# Patient Record
Sex: Female | Born: 1955
Health system: Southern US, Community
[De-identification: ages and names within clinical notes are randomized; demographics above are authoritative.]

## PROBLEM LIST (undated history)

## (undated) DIAGNOSIS — M1990 Unspecified osteoarthritis, unspecified site: Secondary | ICD-10-CM

## (undated) DIAGNOSIS — F419 Anxiety disorder, unspecified: Secondary | ICD-10-CM

## (undated) HISTORY — DX: Unspecified osteoarthritis, unspecified site: M19.90

## (undated) HISTORY — PX: FOOT SURGERY: SHX648

## (undated) HISTORY — DX: Anxiety disorder, unspecified: F41.9

---

## 1999-11-10 ENCOUNTER — Encounter: Payer: Self-pay | Admitting: Orthopedic Surgery

## 1999-11-14 ENCOUNTER — Encounter (INDEPENDENT_AMBULATORY_CARE_PROVIDER_SITE_OTHER): Payer: Self-pay

## 1999-11-14 ENCOUNTER — Ambulatory Visit (HOSPITAL_COMMUNITY): Admission: RE | Admit: 1999-11-14 | Discharge: 1999-11-14 | Payer: Self-pay | Admitting: Orthopedic Surgery

## 1999-12-15 ENCOUNTER — Encounter: Admission: RE | Admit: 1999-12-15 | Discharge: 1999-12-15 | Payer: Self-pay | Admitting: Family Medicine

## 1999-12-15 ENCOUNTER — Encounter: Payer: Self-pay | Admitting: Family Medicine

## 2000-12-17 ENCOUNTER — Encounter: Admission: RE | Admit: 2000-12-17 | Discharge: 2000-12-17 | Payer: Self-pay | Admitting: Family Medicine

## 2000-12-17 ENCOUNTER — Encounter: Payer: Self-pay | Admitting: Family Medicine

## 2002-01-01 ENCOUNTER — Encounter: Payer: Self-pay | Admitting: Family Medicine

## 2002-01-01 ENCOUNTER — Encounter: Admission: RE | Admit: 2002-01-01 | Discharge: 2002-01-01 | Payer: Self-pay | Admitting: Family Medicine

## 2002-12-31 ENCOUNTER — Encounter: Payer: Self-pay | Admitting: Family Medicine

## 2002-12-31 ENCOUNTER — Encounter: Admission: RE | Admit: 2002-12-31 | Discharge: 2002-12-31 | Payer: Self-pay | Admitting: Family Medicine

## 2004-01-13 ENCOUNTER — Encounter: Admission: RE | Admit: 2004-01-13 | Discharge: 2004-01-13 | Payer: Self-pay | Admitting: Family Medicine

## 2004-07-04 ENCOUNTER — Encounter: Admission: RE | Admit: 2004-07-04 | Discharge: 2004-07-04 | Payer: Self-pay | Admitting: Family Medicine

## 2005-02-01 ENCOUNTER — Encounter: Admission: RE | Admit: 2005-02-01 | Discharge: 2005-02-01 | Payer: Self-pay | Admitting: Family Medicine

## 2005-02-07 ENCOUNTER — Encounter: Admission: RE | Admit: 2005-02-07 | Discharge: 2005-02-07 | Payer: Self-pay | Admitting: Family Medicine

## 2005-03-01 ENCOUNTER — Other Ambulatory Visit: Admission: RE | Admit: 2005-03-01 | Discharge: 2005-03-01 | Payer: Self-pay | Admitting: Family Medicine

## 2006-02-11 ENCOUNTER — Encounter: Admission: RE | Admit: 2006-02-11 | Discharge: 2006-02-11 | Payer: Self-pay | Admitting: Family Medicine

## 2006-05-22 ENCOUNTER — Other Ambulatory Visit: Admission: RE | Admit: 2006-05-22 | Discharge: 2006-05-22 | Payer: Self-pay | Admitting: Family Medicine

## 2007-03-26 ENCOUNTER — Encounter: Admission: RE | Admit: 2007-03-26 | Discharge: 2007-03-26 | Payer: Self-pay | Admitting: Family Medicine

## 2007-09-29 ENCOUNTER — Other Ambulatory Visit: Admission: RE | Admit: 2007-09-29 | Discharge: 2007-09-29 | Payer: Self-pay | Admitting: Family Medicine

## 2008-05-04 ENCOUNTER — Encounter: Admission: RE | Admit: 2008-05-04 | Discharge: 2008-05-04 | Payer: Self-pay | Admitting: Family Medicine

## 2008-10-06 ENCOUNTER — Other Ambulatory Visit: Admission: RE | Admit: 2008-10-06 | Discharge: 2008-10-06 | Payer: Self-pay | Admitting: Family Medicine

## 2009-05-06 ENCOUNTER — Encounter: Admission: RE | Admit: 2009-05-06 | Discharge: 2009-05-06 | Payer: Self-pay | Admitting: Family Medicine

## 2010-01-04 ENCOUNTER — Other Ambulatory Visit: Admission: RE | Admit: 2010-01-04 | Discharge: 2010-01-04 | Payer: Self-pay | Admitting: Family Medicine

## 2010-05-08 ENCOUNTER — Encounter: Admission: RE | Admit: 2010-05-08 | Discharge: 2010-05-08 | Payer: Self-pay | Admitting: Family Medicine

## 2010-11-12 ENCOUNTER — Encounter: Payer: Self-pay | Admitting: Family Medicine

## 2011-04-09 ENCOUNTER — Other Ambulatory Visit: Payer: Self-pay | Admitting: Family Medicine

## 2011-04-09 DIAGNOSIS — Z1231 Encounter for screening mammogram for malignant neoplasm of breast: Secondary | ICD-10-CM

## 2011-05-10 ENCOUNTER — Ambulatory Visit
Admission: RE | Admit: 2011-05-10 | Discharge: 2011-05-10 | Disposition: A | Discharge status: Home / Self Care | Payer: 59 | Source: Ambulatory Visit | Attending: Family Medicine | Admitting: Family Medicine

## 2011-05-10 DIAGNOSIS — Z1231 Encounter for screening mammogram for malignant neoplasm of breast: Secondary | ICD-10-CM

## 2012-06-02 ENCOUNTER — Other Ambulatory Visit: Payer: Self-pay | Admitting: Family Medicine

## 2012-06-02 DIAGNOSIS — Z1231 Encounter for screening mammogram for malignant neoplasm of breast: Secondary | ICD-10-CM

## 2012-06-20 ENCOUNTER — Ambulatory Visit
Admission: RE | Admit: 2012-06-20 | Discharge: 2012-06-20 | Disposition: A | Discharge status: Home / Self Care | Payer: 59 | Source: Ambulatory Visit | Attending: Family Medicine | Admitting: Family Medicine

## 2012-06-20 DIAGNOSIS — Z1231 Encounter for screening mammogram for malignant neoplasm of breast: Secondary | ICD-10-CM

## 2013-09-11 ENCOUNTER — Other Ambulatory Visit: Payer: Self-pay

## 2013-09-11 DIAGNOSIS — Z1231 Encounter for screening mammogram for malignant neoplasm of breast: Secondary | ICD-10-CM

## 2013-10-08 ENCOUNTER — Ambulatory Visit: Payer: 59

## 2013-10-09 ENCOUNTER — Ambulatory Visit: Payer: 59

## 2013-10-12 ENCOUNTER — Ambulatory Visit
Admission: RE | Admit: 2013-10-12 | Discharge: 2013-10-12 | Disposition: A | Discharge status: Home / Self Care | Payer: 59 | Source: Ambulatory Visit

## 2013-10-12 DIAGNOSIS — Z1231 Encounter for screening mammogram for malignant neoplasm of breast: Secondary | ICD-10-CM

## 2013-12-09 ENCOUNTER — Other Ambulatory Visit: Payer: Self-pay | Admitting: Family Medicine

## 2013-12-09 ENCOUNTER — Other Ambulatory Visit (HOSPITAL_COMMUNITY)
Admission: RE | Admit: 2013-12-09 | Discharge: 2013-12-09 | Disposition: A | Discharge status: Home / Self Care | Payer: 59 | Source: Ambulatory Visit | Attending: Family Medicine | Admitting: Family Medicine

## 2013-12-09 DIAGNOSIS — Z124 Encounter for screening for malignant neoplasm of cervix: Secondary | ICD-10-CM | POA: Insufficient documentation

## 2014-06-03 ENCOUNTER — Ambulatory Visit (INDEPENDENT_AMBULATORY_CARE_PROVIDER_SITE_OTHER): Payer: BC Managed Care – PPO | Admitting: Physician Assistant

## 2014-06-03 VITALS — BP 110/70 | HR 62 | Temp 98.3°F | Resp 16 | Ht 66.0 in | Wt 147.0 lb

## 2014-06-03 DIAGNOSIS — J029 Acute pharyngitis, unspecified: Secondary | ICD-10-CM

## 2014-06-03 LAB — POCT RAPID STREP A (OFFICE): Rapid Strep A Screen: NEGATIVE

## 2014-06-03 MED ORDER — FIRST-DUKES MOUTHWASH MT SUSP: 5.0000 mL | OROMUCOSAL | Status: DC | PRN

## 2014-06-03 NOTE — Progress Notes (Signed)
   Subjective:    Patient ID: Brianna Olson, female    DOB: 1956/09/07, 58 y.o.   MRN: [id number]  HPI 58 year old female presents for evaluation of acute onset of sore throat.  States symptoms started this morning when she woke up.  She has had chills and fatigue but no documented fever.  Admits she is worried because her husband just had bilateral knee replacement and is still in the hospital - she does not want to get him sick.   Denies nausea, vomiting, congestion, cough, dizziness, ear pain, or sinus congestion.   No known strep contacts or hx of frequent strep infections.                                                                                                                      Review of Systems  Constitutional: Positive for chills and fatigue. Negative for fever.  HENT: Positive for sore throat. Negative for congestion, ear pain and sinus pressure.   Respiratory: Negative for cough and shortness of breath.   Gastrointestinal: Negative for nausea and vomiting.  Neurological: Negative for dizziness and headaches.       Objective:   Physical Exam  Constitutional: She is oriented to person, place, and time. She appears well-developed and well-nourished.  HENT:  Head: Normocephalic and atraumatic.  Right Ear: Hearing, tympanic membrane, external ear and ear canal normal.  Left Ear: Hearing, tympanic membrane, external ear and ear canal normal.  Mouth/Throat: Uvula is midline and mucous membranes are normal. Posterior oropharyngeal erythema present. No oropharyngeal exudate, posterior oropharyngeal edema or tonsillar abscesses.  Eyes: Conjunctivae are normal.  Neck: Normal range of motion. Neck supple.  Cardiovascular: Normal rate, regular rhythm and normal heart sounds.   Pulmonary/Chest: Effort normal and breath sounds normal.  Lymphadenopathy:    She has no cervical adenopathy.  Neurological: She is alert and oriented to person, place, and time.  Psychiatric: She has a  normal mood and affect. Her behavior is normal. Judgment and thought content normal.     Results for orders placed in visit on 06/03/14  POCT RAPID STREP A (OFFICE)      Result Value Ref Range   Rapid Strep A Screen Negative  Negative          Assessment & Plan:  Acute pharyngitis, unspecified pharyngitis type - Plan: POCT rapid strep A, Throat culture Loney Loh([facility]), Diphenhyd-Hydrocort-Nystatin (FIRST-DUKES MOUTHWASH) SUSP  Likely viral pharyngitis - will treat conservatively  May use duke's mouthwash q2-3hours prn pain. Recommend ibuprofen or tylenol  Good handwashing. RTC precautions discussed.  Follow up if symptoms worsening or fail to improve.

## 2014-06-05 LAB — CULTURE, GROUP A STREP: Organism ID, Bacteria: NORMAL

## 2014-11-22 ENCOUNTER — Other Ambulatory Visit: Payer: Self-pay

## 2014-11-22 DIAGNOSIS — Z1231 Encounter for screening mammogram for malignant neoplasm of breast: Secondary | ICD-10-CM

## 2014-11-30 ENCOUNTER — Ambulatory Visit: Payer: Self-pay

## 2014-12-07 ENCOUNTER — Ambulatory Visit: Payer: Self-pay

## 2014-12-17 ENCOUNTER — Ambulatory Visit
Admission: RE | Admit: 2014-12-17 | Discharge: 2014-12-17 | Disposition: A | Discharge status: Home / Self Care | Payer: BLUE CROSS/BLUE SHIELD | Source: Ambulatory Visit

## 2014-12-17 DIAGNOSIS — Z1231 Encounter for screening mammogram for malignant neoplasm of breast: Secondary | ICD-10-CM

## 2015-05-24 ENCOUNTER — Other Ambulatory Visit (HOSPITAL_COMMUNITY): Payer: Self-pay | Admitting: Otolaryngology

## 2015-05-24 DIAGNOSIS — K118 Other diseases of salivary glands: Secondary | ICD-10-CM

## 2015-05-26 ENCOUNTER — Other Ambulatory Visit (HOSPITAL_COMMUNITY): Payer: Self-pay | Admitting: Otolaryngology

## 2015-05-26 ENCOUNTER — Ambulatory Visit (HOSPITAL_COMMUNITY)
Admission: RE | Admit: 2015-05-26 | Discharge: 2015-05-26 | Disposition: A | Discharge status: Home / Self Care | Payer: BLUE CROSS/BLUE SHIELD | Source: Ambulatory Visit | Attending: Otolaryngology | Admitting: Otolaryngology

## 2015-05-26 DIAGNOSIS — R220 Localized swelling, mass and lump, head: Secondary | ICD-10-CM | POA: Diagnosis not present

## 2015-05-26 DIAGNOSIS — K118 Other diseases of salivary glands: Secondary | ICD-10-CM

## 2015-05-31 ENCOUNTER — Other Ambulatory Visit: Payer: Self-pay | Admitting: Radiology

## 2015-06-01 ENCOUNTER — Ambulatory Visit (HOSPITAL_COMMUNITY)
Admission: RE | Admit: 2015-06-01 | Discharge: 2015-06-01 | Disposition: A | Discharge status: Home / Self Care | Payer: BLUE CROSS/BLUE SHIELD | Source: Ambulatory Visit | Attending: Otolaryngology | Admitting: Otolaryngology

## 2015-06-01 ENCOUNTER — Encounter (HOSPITAL_COMMUNITY): Payer: Self-pay

## 2015-06-01 DIAGNOSIS — K119 Disease of salivary gland, unspecified: Secondary | ICD-10-CM | POA: Diagnosis not present

## 2015-06-01 DIAGNOSIS — F17200 Nicotine dependence, unspecified, uncomplicated: Secondary | ICD-10-CM | POA: Diagnosis not present

## 2015-06-01 DIAGNOSIS — R221 Localized swelling, mass and lump, neck: Secondary | ICD-10-CM | POA: Diagnosis present

## 2015-06-01 DIAGNOSIS — K118 Other diseases of salivary glands: Secondary | ICD-10-CM

## 2015-06-01 LAB — CBC WITH DIFFERENTIAL/PLATELET
Basophils Absolute: 0 10*3/uL (ref 0.0–0.1)
Basophils Relative: 1 % (ref 0–1)
EOS PCT: 4 % (ref 0–5)
Eosinophils Absolute: 0.3 10*3/uL (ref 0.0–0.7)
HEMATOCRIT: 40.2 % (ref 36.0–46.0)
Hemoglobin: 13.4 g/dL (ref 12.0–15.0)
LYMPHS ABS: 1.9 10*3/uL (ref 0.7–4.0)
LYMPHS PCT: 29 % (ref 12–46)
MCH: 30.8 pg (ref 26.0–34.0)
MCHC: 33.3 g/dL (ref 30.0–36.0)
MCV: 92.4 fL (ref 78.0–100.0)
MONO ABS: 0.5 10*3/uL (ref 0.1–1.0)
Monocytes Relative: 8 % (ref 3–12)
Neutro Abs: 3.8 10*3/uL (ref 1.7–7.7)
Neutrophils Relative %: 58 % (ref 43–77)
Platelets: 287 10*3/uL (ref 150–400)
RBC: 4.35 MIL/uL (ref 3.87–5.11)
RDW: 13.4 % (ref 11.5–15.5)
WBC: 6.5 10*3/uL (ref 4.0–10.5)

## 2015-06-01 LAB — PROTIME-INR
INR: 1.09 (ref 0.00–1.49)
Prothrombin Time: 14.3 seconds (ref 11.6–15.2)

## 2015-06-01 MED ORDER — MIDAZOLAM HCL 2 MG/2ML IJ SOLN
INTRAMUSCULAR | Status: AC
  Filled 2015-06-01: qty 2

## 2015-06-01 MED ORDER — FENTANYL CITRATE (PF) 100 MCG/2ML IJ SOLN
INTRAMUSCULAR | Status: AC
  Filled 2015-06-01: qty 2

## 2015-06-01 MED ORDER — HYDROCODONE-ACETAMINOPHEN 5-325 MG PO TABS: 1.0000 | ORAL_TABLET | ORAL | Status: DC | PRN

## 2015-06-01 MED ORDER — MIDAZOLAM HCL 2 MG/2ML IJ SOLN
INTRAMUSCULAR | Status: AC | PRN
  Administered 2015-06-01 (×4): 1 mg via INTRAVENOUS

## 2015-06-01 MED ORDER — SODIUM CHLORIDE 0.9 % IV SOLN
INTRAVENOUS | Status: DC
  Administered 2015-06-01: 14:00:00 via INTRAVENOUS

## 2015-06-01 MED ORDER — FENTANYL CITRATE (PF) 100 MCG/2ML IJ SOLN
INTRAMUSCULAR | Status: AC | PRN
  Administered 2015-06-01 (×3): 50 ug via INTRAVENOUS

## 2015-06-01 MED ORDER — LIDOCAINE HCL (PF) 1 % IJ SOLN
INTRAMUSCULAR | Status: AC
  Filled 2015-06-01: qty 10

## 2015-06-01 NOTE — Discharge Instructions (Signed)
Needle Biopsy °Care After °These instructions give you information on caring for yourself after your procedure. Your doctor may also give you more specific instructions. Call your doctor if you have any problems or questions after your procedure. °HOME CARE °· Rest for 4 hours after your biopsy, except for getting up to go to the bathroom or as told. °· Keep the places where the needles were put in clean and dry. °¨ Do not put powder or lotion on the sites. °¨ Do not shower until 24 hours after the test. Remove all bandages (dressings) before showering. °¨ Remove all bandages at least once every day. Gently clean the sites with soap and water. Keep putting a new bandage on until the skin is closed. °Finding out the results of your test °Ask your doctor when your test results will be ready. Make sure you follow up and get the test results. °GET HELP RIGHT AWAY IF:  °· You have shortness of breath or trouble breathing. °· You have pain or cramping in your belly (abdomen). °· You feel sick to your stomach (nauseous) or throw up (vomit). °· Any of the places where the needles were put in: °¨ Are puffy (swollen) or red. °¨ Are sore or hot to the touch. °¨ Are draining yellowish-white fluid (pus). °¨ Are bleeding after 10 minutes of pressing down on the site. Have someone keep pressing on any place that is bleeding until you see a doctor. °· You have any unusual pain that will not stop. °· You have a fever. °If you go to the emergency room, tell the nurse that you had a biopsy. Take this paper with you to show the nurse. °MAKE SURE YOU:  °· Understand these instructions. °· Will watch your condition. °· Will get help right away if you are not doing well or get worse. °Document Released: 09/20/2008 Document Revised: 12/31/2011 Document Reviewed: 09/20/2008 °ExitCare® Patient Information ©2015 [employer]. This information is not intended to replace advice given to you by your health care provider. Make sure you discuss  any questions you have with your health care provider. ° °

## 2015-06-01 NOTE — Sedation Documentation (Signed)
Bandaid to R neck intact

## 2015-06-01 NOTE — Procedures (Signed)
Interventional Radiology Procedure Note  Procedure: US guided core biopsy right parotid nodule  Complications: None  Estimated Blood Loss: 0  Recommendations: - DC home - Path penidng  Signed,  Sterling Big, MD

## 2015-06-01 NOTE — H&P (Signed)
Chief Complaint: Patient was seen in consultation today for Rt parotid gland mass at the request of Crossley,James J  Referring Physician(s): Crossley,James J  History of Present Illness: Brianna Olson is a 59 y.o. female   Pt noticed swollen area at Rt neck at least 2 months ago nontender No change noted No larger; no smaller +daily smoker [place]: Rt parotid gland: Heterogeneous nodule in the region of clinical concern measuring 3.4 cm x 1.7 cm x 2.2 cm. No significant internal flow. Nodule is inseparable from the parotid tissue.  Now scheduled for biopsy of same   Past Medical History  Diagnosis Date  . Anxiety   . Arthritis     Past Surgical History  Procedure Laterality Date  . Foot surgery Bilateral     Allergies: Review of patient's allergies indicates no known allergies.  Medications: Prior to Admission medications   Medication Sig Start Date End Date Taking? Authorizing Provider  ALPRAZolam Prudy Feeler) 0.25 MG tablet Take 0.25 mg by mouth as needed for anxiety.    Historical Provider, MD  Diphenhyd-Hydrocort-Nystatin (FIRST-DUKES MOUTHWASH) SUSP Use as directed 5-10 mLs in the mouth or throat every 2 (two) hours as needed. Use 1:1 ratio with viscous lidocaine 06/03/14   Nelva Nay, PA-C     Family History  Problem Relation Age of Onset  . Diabetes Mother   . Heart disease Mother   . Hypertension Mother   . Heart disease Father   . Hypertension Father   . Cancer Father     lung cancer  . Hypertension Sister   . Cancer Maternal Grandmother     stomach cancer  . Cancer Paternal Grandmother     ovarian cancer  . Cancer Paternal Grandfather     bone cancer    Social History   Social History  . Marital Status: Married    Spouse Name: N/A  . Number of Children: N/A  . Years of Education: N/A   Social History Main Topics  . Smoking status: Current Every Day Smoker  . Smokeless tobacco: None  . Alcohol Use: None  . Drug Use: None  . Sexual  Activity: Not Asked   Other Topics Concern  . None   Social History Narrative     Review of Systems: A 12 point ROS discussed and pertinent positives are indicated in the HPI above.  All other systems are negative.  Review of Systems  HENT: Negative for ear pain and sore throat.   Respiratory: Negative for shortness of breath.   Gastrointestinal: Negative for abdominal pain.  Psychiatric/Behavioral: Negative for behavioral problems and confusion.    Vital Signs: BP 141/69 mmHg  Pulse 60  Temp(Src) 97.8 F (36.6 C)  Resp 18  Ht 5\' 6"  (1.676 m)  Wt 150 lb (68.04 kg)  BMI 24.22 kg/m2  SpO2 100%  Physical Exam  Constitutional: She is oriented to person, place, and time.  Cardiovascular: Normal rate, regular rhythm and normal heart sounds.   No murmur heard. Pulmonary/Chest: Effort normal and breath sounds normal. She has no wheezes.  Abdominal: Soft. Bowel sounds are normal. There is no tenderness.  Musculoskeletal: Normal range of motion.  Neurological: She is alert and oriented to person, place, and time.  Skin: Skin is warm and dry.  Psychiatric: She has a normal mood and affect. Her behavior is normal. Judgment and thought content normal.  Nursing note and vitals reviewed.   Mallampati Score:  MD Evaluation Airway: WNL Heart: WNL Abdomen: WNL  Chest/ Lungs: WNL ASA  Classification: 2 Mallampati/Airway Score: One  Imaging: US Soft Tissue Head/neck  05/26/2015   CLINICAL DATA:  60 year old female with a history of palpable right parotid mass.  EXAM: ULTRASOUND OF HEAD/NECK SOFT TISSUES  TECHNIQUE: Ultrasound examination of the head and neck soft tissues was performed in the area of clinical concern.  COMPARISON:  None.  FINDINGS: Targeted grayscale and color ultrasound of the region of clinical concern.  Heterogeneous nodule in the region of clinical concern measuring 3.4 cm x 1.7 cm x 2.2 cm. No significant internal flow. Nodule is inseparable from the parotid  tissue.  IMPRESSION: Nodule within the parotid gland. Most likely entity would be a benign lesion, although the differential diagnosis of parotid tumors includes both benign and malignant lesions. Further evaluation with contrast-enhanced neck CT may be useful.  Signed,  Yvone Neu. Loreta Ave, DO  [facility]  [facility]   Electronically Signed   By: Gilmer Mor D.O.   On: 05/26/2015 12:08    Labs:  CBC: No results for input(s): WBC, HGB, HCT, PLT in the last 8760 hours.  COAGS: No results for input(s): INR, APTT in the last 8760 hours.  BMP: No results for input(s): NA, K, CL, CO2, GLUCOSE, BUN, CALCIUM, CREATININE, GFRNONAA, GFRAA in the last 8760 hours.  Invalid input(s): CMP  LIVER FUNCTION TESTS: No results for input(s): BILITOT, AST, ALT, ALKPHOS, PROT, ALBUMIN in the last 8760 hours.  TUMOR MARKERS: No results for input(s): AFPTM, CEA, CA199, CHROMGRNA in the last 8760 hours.  Assessment and Plan:  Rt parotid gland mass Scheduled for bx Risks and Benefits discussed with the patient including, but not limited to bleeding, infection, damage to adjacent structures or low yield requiring additional tests. All of the patient's questions were answered, patient is agreeable to proceed. Consent signed and in chart.   Thank you for this interesting consult.  I greatly enjoyed meeting Brianna Olson and look forward to participating in their care.  A copy of this report was sent to the requesting provider on this date.  Signed: Olie Dibert A 06/01/2015, 1:11 PM   I spent a total of  30 Minutes   in face to face in clinical consultation, greater than 50% of which was counseling/coordinating care for Rt parotid gland bx

## 2015-08-01 ENCOUNTER — Other Ambulatory Visit: Payer: Self-pay | Admitting: Family Medicine

## 2015-08-01 ENCOUNTER — Other Ambulatory Visit (HOSPITAL_COMMUNITY)
Admission: RE | Admit: 2015-08-01 | Discharge: 2015-08-01 | Disposition: A | Discharge status: Home / Self Care | Payer: BLUE CROSS/BLUE SHIELD | Source: Ambulatory Visit | Attending: Family Medicine | Admitting: Family Medicine

## 2015-08-01 DIAGNOSIS — Z01419 Encounter for gynecological examination (general) (routine) without abnormal findings: Secondary | ICD-10-CM | POA: Insufficient documentation

## 2015-08-02 LAB — CYTOLOGY - PAP

## 2015-08-23 ENCOUNTER — Ambulatory Visit (INDEPENDENT_AMBULATORY_CARE_PROVIDER_SITE_OTHER): Payer: BLUE CROSS/BLUE SHIELD | Admitting: Podiatry

## 2015-08-23 ENCOUNTER — Encounter: Payer: Self-pay | Admitting: Podiatry

## 2015-08-23 VITALS — BP 127/83 | HR 64 | Resp 16

## 2015-08-23 DIAGNOSIS — Q828 Other specified congenital malformations of skin: Secondary | ICD-10-CM

## 2015-08-23 NOTE — Progress Notes (Signed)
   Subjective:    Patient ID: Brianna Olson, female    DOB: [birth year], 59 y.o.   MRN: [id number]  HPI  Pt presents with painful wart lesion on the bottom of her left foot making it difficult for her to walk  Review of Systems  All other systems reviewed and are negative.      Objective:   Physical Exam        Assessment & Plan:

## 2015-08-26 NOTE — Progress Notes (Signed)
Subjective:     Patient ID: Brianna Olson, female   DOB: 1955-10-26, 59 y.o.   MRN: [id number]  HPI patient presents with lesion on the bottom of her left foot that is very painful and makes walking difficult. Patient states that's been there for several months   Review of Systems  All other systems reviewed and are negative.      Objective:   Physical Exam  Constitutional: She is oriented to person, place, and time.  Cardiovascular: Intact distal pulses.   Musculoskeletal: Normal range of motion.  Neurological: She is oriented to person, place, and time.  Skin: Skin is warm and dry.  Nursing note and vitals reviewed.  neurovascular status found to be intact muscle strength adequate range of motion within normal limits with painful lesion plantar aspect left third metatarsal that is deep in its nature with a lucent-type core and is very sore when pressed. It is near the metatarsal head itself     Assessment:      probable porokeratotic type lesion which may be related to bone structure    Plan:      H&P and condition reviewed with patient. Today deep debridement of lesion was accomplished and I did apply a small amount of medication to try to soften the lesion. She will reappoint when symptomatic

## 2016-03-20 ENCOUNTER — Other Ambulatory Visit: Payer: Self-pay

## 2016-03-20 DIAGNOSIS — Z1231 Encounter for screening mammogram for malignant neoplasm of breast: Secondary | ICD-10-CM

## 2016-03-28 ENCOUNTER — Ambulatory Visit
Admission: RE | Admit: 2016-03-28 | Discharge: 2016-03-28 | Disposition: A | Discharge status: Home / Self Care | Payer: BLUE CROSS/BLUE SHIELD | Source: Ambulatory Visit

## 2016-03-28 DIAGNOSIS — Z1231 Encounter for screening mammogram for malignant neoplasm of breast: Secondary | ICD-10-CM

## 2016-08-06 IMAGING — MG MM SCREEN MAMMOGRAM BILATERAL
4 series · 4 of 4 positions shown · non-contrast
Comparison: Previous exam(s).

CLINICAL DATA: Screening.

EXAM:
DIGITAL SCREENING BILATERAL MAMMOGRAM WITH CAD

[R CC]
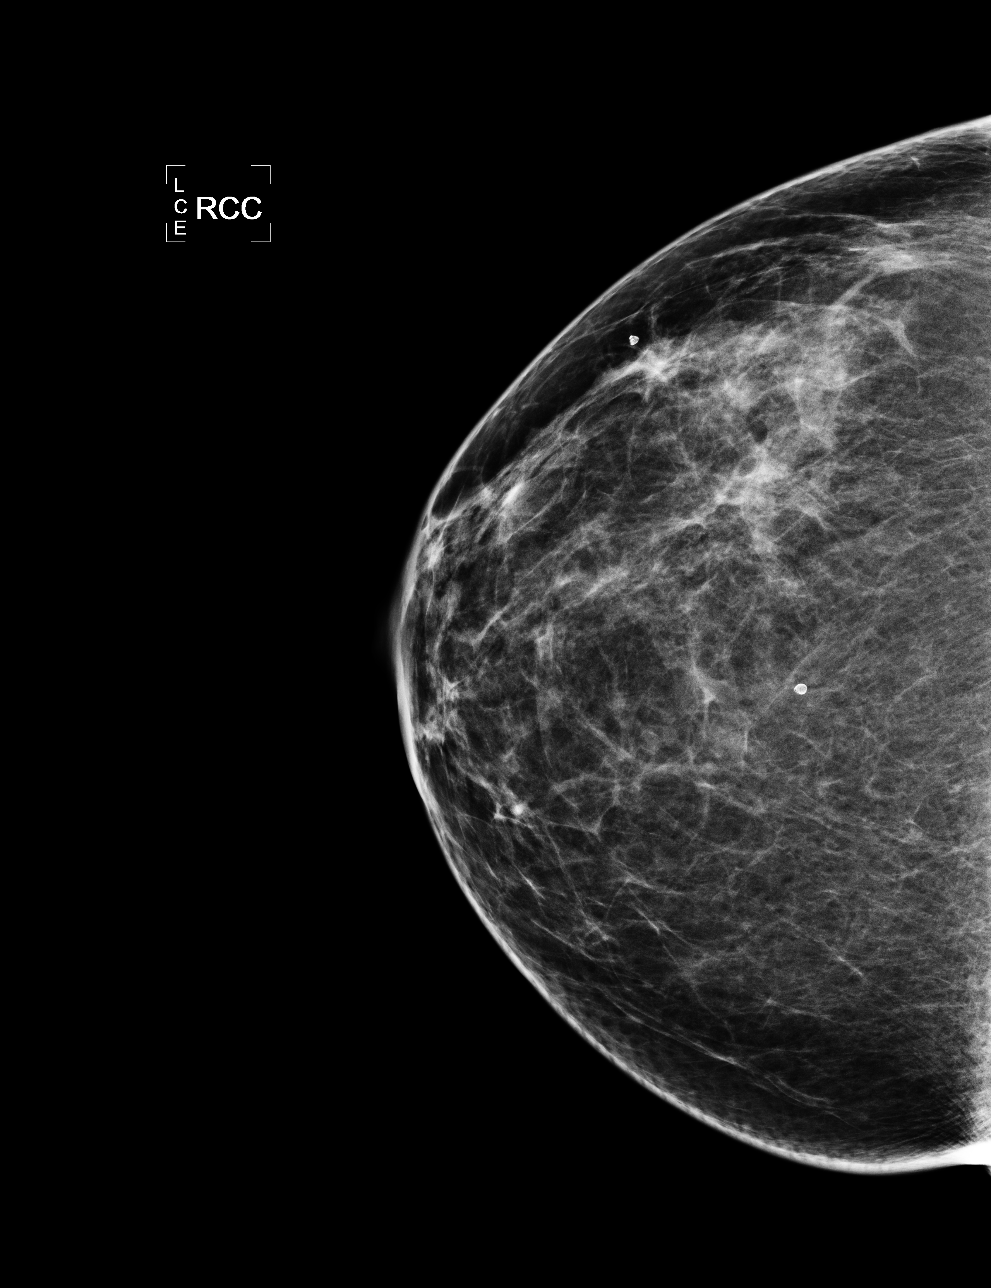

[L CC]
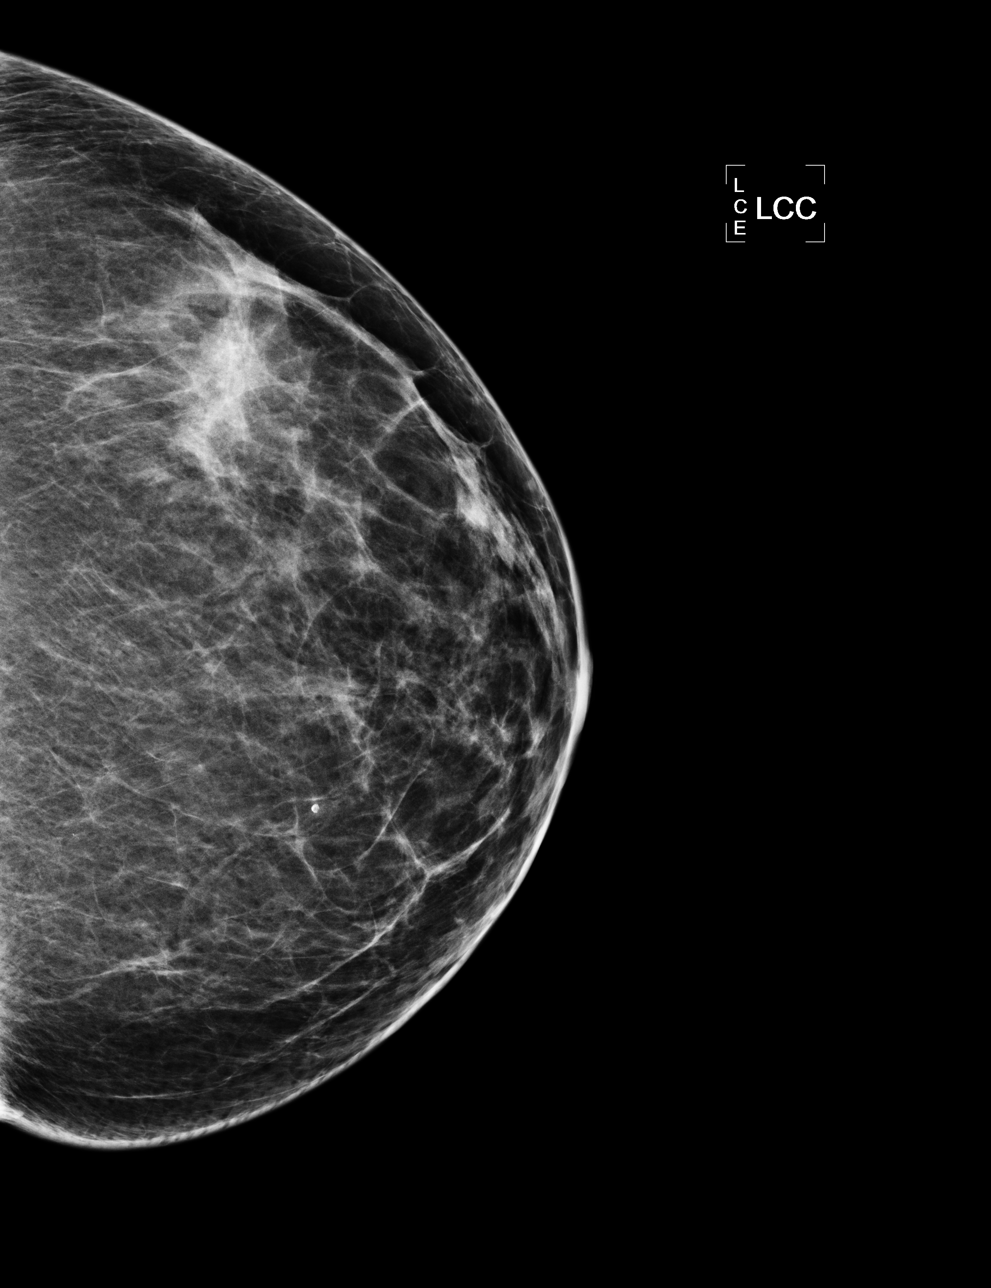

[L MLO]
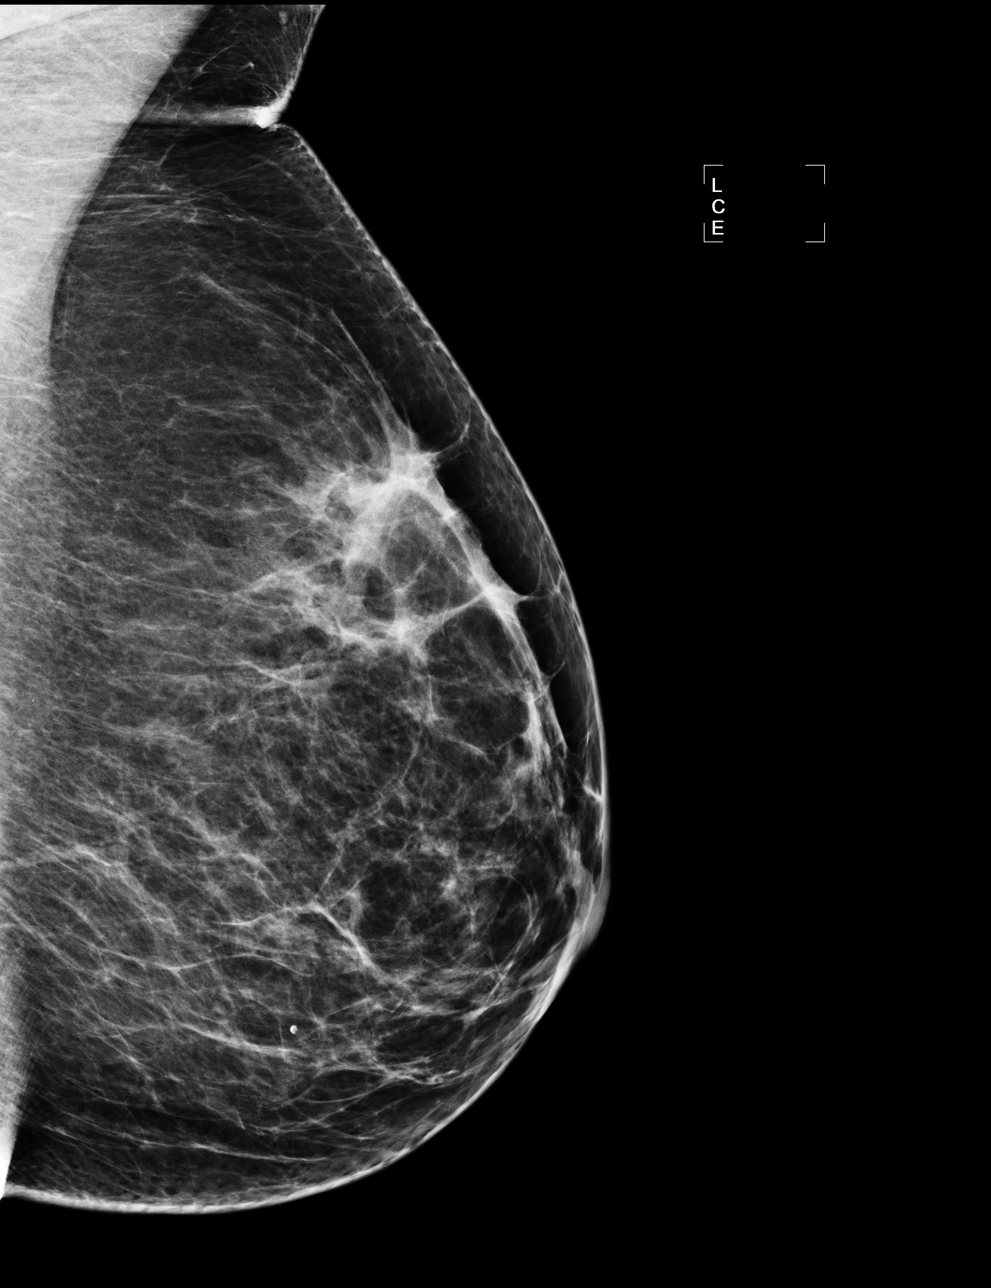

[R MLO]
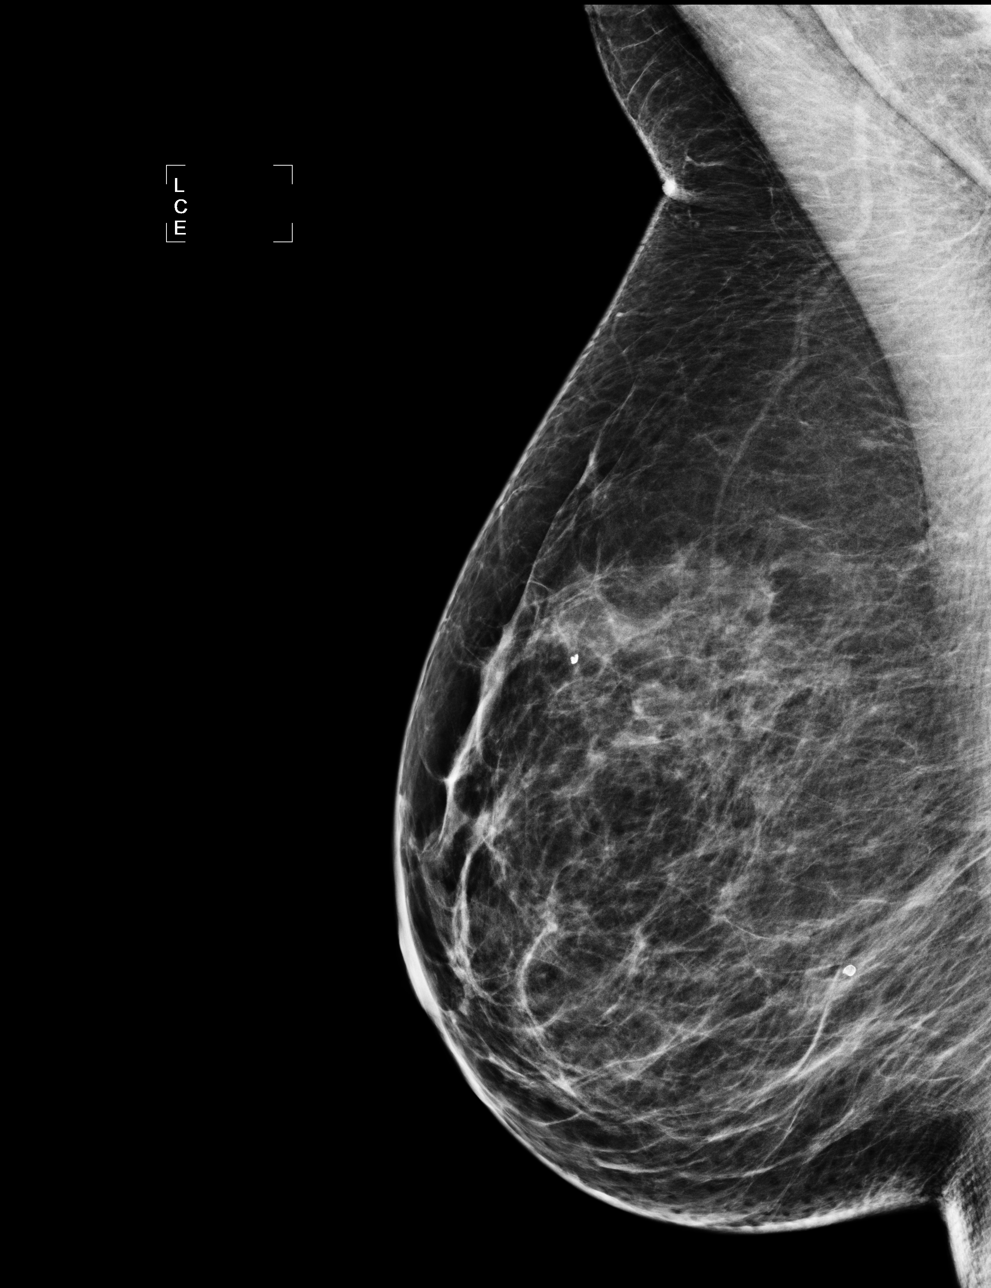

[4 of 4 positions shown; findings below may reference images not displayed]

ACR Breast Density Category b: There are scattered areas of
fibroglandular density.
FINDINGS: There are no findings suspicious for malignancy. Images were
processed with CAD.
IMPRESSION: No mammographic evidence of malignancy. A result letter of this
screening mammogram will be mailed directly to the patient.

RECOMMENDATION:
Screening mammogram in one year. (Code:AS-G-LCT)

BI-RADS CATEGORY  1: Negative.

## 2017-05-06 ENCOUNTER — Other Ambulatory Visit: Payer: Self-pay | Admitting: Family Medicine

## 2017-05-06 DIAGNOSIS — Z1231 Encounter for screening mammogram for malignant neoplasm of breast: Secondary | ICD-10-CM

## 2017-05-17 ENCOUNTER — Ambulatory Visit: Payer: BLUE CROSS/BLUE SHIELD

## 2017-05-27 ENCOUNTER — Ambulatory Visit
Admission: RE | Admit: 2017-05-27 | Discharge: 2017-05-27 | Disposition: A | Discharge status: Home / Self Care | Payer: BLUE CROSS/BLUE SHIELD | Source: Ambulatory Visit | Attending: Family Medicine | Admitting: Family Medicine

## 2017-05-27 DIAGNOSIS — Z1231 Encounter for screening mammogram for malignant neoplasm of breast: Secondary | ICD-10-CM

## 2017-06-13 ENCOUNTER — Ambulatory Visit (INDEPENDENT_AMBULATORY_CARE_PROVIDER_SITE_OTHER): Payer: BLUE CROSS/BLUE SHIELD | Admitting: Podiatry

## 2017-06-13 ENCOUNTER — Ambulatory Visit (INDEPENDENT_AMBULATORY_CARE_PROVIDER_SITE_OTHER): Payer: BLUE CROSS/BLUE SHIELD

## 2017-06-13 ENCOUNTER — Encounter: Payer: Self-pay | Admitting: Podiatry

## 2017-06-13 DIAGNOSIS — M722 Plantar fascial fibromatosis: Secondary | ICD-10-CM

## 2017-06-13 DIAGNOSIS — M79672 Pain in left foot: Secondary | ICD-10-CM

## 2017-06-13 DIAGNOSIS — Q828 Other specified congenital malformations of skin: Secondary | ICD-10-CM

## 2017-06-13 MED ORDER — TRIAMCINOLONE ACETONIDE 10 MG/ML IJ SUSP
10.0000 mg | Freq: Once | INTRAMUSCULAR | Status: AC
  Administered 2017-06-13: 10 mg

## 2017-06-13 NOTE — Patient Instructions (Signed)

## 2017-06-13 NOTE — Progress Notes (Signed)
Subjective:    Patient ID: Brianna Olson, female   DOB: 61 y.o.   MRN: [id number]   HPI patient states that she's been developing increased pain in the bottom of her left heel over the last 6 months. Worse when getting up in the morning or after periods of sitting and that also on the right foot there is keratotic lesion chronic subsecond that's become painful over the last several months    ROS      Objective:  Physical Exam neurovascular status intact with exquisite acute plantar fasciitis left with fluid buildup and lesion formation second metatarsal right that's painful     Assessment:    Acute plantar fasciitis left porokeratotic lesion right     Plan:   H&P conditions reviewed and today injected the left plantar fascia 3 mg Kenalog 5 mg Xylocaine and for the right I debrided the lesion fully with no iatrogenic bleeding and applied padding. I also went ahead and applied fascial brace left with instructions on usage placed on oral anti-inflammatory diclofenac 75 mg twice a day and reappoint to recheck in the next several weeks  X-rays indicate that there is small spur with no indication of stress fracture arthritis

## 2017-06-27 ENCOUNTER — Ambulatory Visit (INDEPENDENT_AMBULATORY_CARE_PROVIDER_SITE_OTHER): Payer: BLUE CROSS/BLUE SHIELD | Admitting: Podiatry

## 2017-06-27 ENCOUNTER — Encounter: Payer: Self-pay | Admitting: Podiatry

## 2017-06-27 DIAGNOSIS — M722 Plantar fascial fibromatosis: Secondary | ICD-10-CM | POA: Diagnosis not present

## 2017-07-02 ENCOUNTER — Other Ambulatory Visit: Payer: BLUE CROSS/BLUE SHIELD | Admitting: Orthotics

## 2017-07-02 DIAGNOSIS — Z86018 Personal history of other benign neoplasm: Secondary | ICD-10-CM | POA: Diagnosis not present

## 2017-07-02 DIAGNOSIS — D225 Melanocytic nevi of trunk: Secondary | ICD-10-CM | POA: Diagnosis not present

## 2017-07-02 DIAGNOSIS — L309 Dermatitis, unspecified: Secondary | ICD-10-CM | POA: Diagnosis not present

## 2017-07-02 DIAGNOSIS — L821 Other seborrheic keratosis: Secondary | ICD-10-CM | POA: Diagnosis not present

## 2017-07-03 NOTE — Progress Notes (Signed)
Subjective:    Patient ID: Brianna Olson, female   DOB: 61 y.o.   MRN: [id number]   HPI patient presents with inflamed plantar fascial that's improved but long-term history of chronic structural issues    ROS      Objective:  Physical Exam neurovascular status intact with continued moderate discomfort plantar fascial left over right with fluid buildup and history of porokeratotic lesion     Assessment:    Chronic plantar fasciitis with moderate improvement but foot structural issues along with porokeratosis     Plan:    Reviewed both conditions and at this time I have recommended orthotics to support the plantar arch take pressure off the heels with consideration of trying to offload lesions on the metatarsal heads if possible. Patient is referred to pad orthotist for this

## 2017-07-15 ENCOUNTER — Other Ambulatory Visit: Payer: BLUE CROSS/BLUE SHIELD | Admitting: Orthotics

## 2017-07-18 ENCOUNTER — Ambulatory Visit (INDEPENDENT_AMBULATORY_CARE_PROVIDER_SITE_OTHER): Payer: BLUE CROSS/BLUE SHIELD | Admitting: Orthotics

## 2017-07-18 DIAGNOSIS — Q828 Other specified congenital malformations of skin: Secondary | ICD-10-CM

## 2017-07-18 DIAGNOSIS — M722 Plantar fascial fibromatosis: Secondary | ICD-10-CM

## 2017-07-18 NOTE — Progress Notes (Signed)

## 2017-08-08 ENCOUNTER — Ambulatory Visit: Payer: BLUE CROSS/BLUE SHIELD | Admitting: Orthotics

## 2017-08-08 DIAGNOSIS — M722 Plantar fascial fibromatosis: Secondary | ICD-10-CM

## 2017-08-08 DIAGNOSIS — Q828 Other specified congenital malformations of skin: Secondary | ICD-10-CM

## 2017-08-08 NOTE — Progress Notes (Signed)
Patient came in today to pick up custom made foot orthotics.  The goals were accomplished and the patient reported no dissatisfaction with said orthotics.  Patient was advised of breakin period and how to report any issues. 

## 2017-08-30 ENCOUNTER — Other Ambulatory Visit: Payer: Self-pay | Admitting: Occupational Medicine

## 2017-08-30 ENCOUNTER — Ambulatory Visit: Payer: Self-pay

## 2017-08-30 DIAGNOSIS — M79645 Pain in left finger(s): Secondary | ICD-10-CM

## 2017-11-25 DIAGNOSIS — E7800 Pure hypercholesterolemia, unspecified: Secondary | ICD-10-CM | POA: Diagnosis not present

## 2017-11-25 DIAGNOSIS — Z23 Encounter for immunization: Secondary | ICD-10-CM | POA: Diagnosis not present

## 2017-11-25 DIAGNOSIS — Z0000 Encounter for general adult medical examination without abnormal findings: Secondary | ICD-10-CM | POA: Diagnosis not present

## 2017-12-12 DIAGNOSIS — J00 Acute nasopharyngitis [common cold]: Secondary | ICD-10-CM | POA: Diagnosis not present

## 2018-07-11 ENCOUNTER — Other Ambulatory Visit: Payer: Self-pay | Admitting: Family Medicine

## 2018-07-11 DIAGNOSIS — Z1231 Encounter for screening mammogram for malignant neoplasm of breast: Secondary | ICD-10-CM

## 2018-08-15 ENCOUNTER — Ambulatory Visit
Admission: RE | Admit: 2018-08-15 | Discharge: 2018-08-15 | Disposition: A | Discharge status: Home / Self Care | Payer: BLUE CROSS/BLUE SHIELD | Source: Ambulatory Visit | Attending: Family Medicine | Admitting: Family Medicine

## 2018-08-15 DIAGNOSIS — Z1231 Encounter for screening mammogram for malignant neoplasm of breast: Secondary | ICD-10-CM | POA: Diagnosis not present

## 2018-08-19 DIAGNOSIS — Z86018 Personal history of other benign neoplasm: Secondary | ICD-10-CM | POA: Diagnosis not present

## 2018-08-19 DIAGNOSIS — L821 Other seborrheic keratosis: Secondary | ICD-10-CM | POA: Diagnosis not present

## 2018-08-19 DIAGNOSIS — Z23 Encounter for immunization: Secondary | ICD-10-CM | POA: Diagnosis not present

## 2018-08-19 DIAGNOSIS — D2272 Melanocytic nevi of left lower limb, including hip: Secondary | ICD-10-CM | POA: Diagnosis not present

## 2018-08-19 DIAGNOSIS — L720 Epidermal cyst: Secondary | ICD-10-CM | POA: Diagnosis not present

## 2018-08-25 ENCOUNTER — Ambulatory Visit (HOSPITAL_COMMUNITY)
Admission: EM | Admit: 2018-08-25 | Discharge: 2018-08-25 | Disposition: A | Discharge status: Home / Self Care | Payer: BLUE CROSS/BLUE SHIELD

## 2018-08-25 ENCOUNTER — Encounter (HOSPITAL_COMMUNITY): Payer: Self-pay

## 2018-08-25 ENCOUNTER — Ambulatory Visit (INDEPENDENT_AMBULATORY_CARE_PROVIDER_SITE_OTHER): Payer: BLUE CROSS/BLUE SHIELD

## 2018-08-25 DIAGNOSIS — J209 Acute bronchitis, unspecified: Secondary | ICD-10-CM

## 2018-08-25 DIAGNOSIS — F17210 Nicotine dependence, cigarettes, uncomplicated: Secondary | ICD-10-CM | POA: Diagnosis not present

## 2018-08-25 DIAGNOSIS — J069 Acute upper respiratory infection, unspecified: Secondary | ICD-10-CM | POA: Diagnosis not present

## 2018-08-25 DIAGNOSIS — R05 Cough: Secondary | ICD-10-CM | POA: Diagnosis not present

## 2018-08-25 MED ORDER — IPRATROPIUM-ALBUTEROL 0.5-2.5 (3) MG/3ML IN SOLN
3.0000 mL | Freq: Once | RESPIRATORY_TRACT | Status: AC
  Administered 2018-08-25: 3 mL via RESPIRATORY_TRACT

## 2018-08-25 MED ORDER — IPRATROPIUM-ALBUTEROL 0.5-2.5 (3) MG/3ML IN SOLN
RESPIRATORY_TRACT | Status: AC
  Filled 2018-08-25: qty 3

## 2018-08-25 MED ORDER — ALBUTEROL SULFATE 108 (90 BASE) MCG/ACT IN AEPB: 1.0000 | INHALATION_SPRAY | RESPIRATORY_TRACT | 0 refills | Status: AC | PRN

## 2018-08-25 MED ORDER — GUAIFENESIN ER 600 MG PO TB12: 1200.0000 mg | ORAL_TABLET | Freq: Two times a day (BID) | ORAL | 0 refills | Status: AC | PRN

## 2018-08-25 NOTE — ED Triage Notes (Signed)
Pt presents with complaints of fatigue, chest congestion and ear congestion since Tuesday.

## 2018-08-25 NOTE — Discharge Instructions (Signed)
Push fluids to ensure adequate hydration and keep secretions thin.  Inhaler as needed for wheezing, chest tightness, cough.  Mucinex as expectorant or may continue with advil cold and sinus.  If symptoms worsen or do not improve in the next week to return to be seen or to follow up with your PCP.

## 2018-08-25 NOTE — ED Provider Notes (Signed)
[facility]    CSN: [id number] Arrival date & time: 08/25/18  0831     History   Chief Complaint Chief Complaint  Patient presents with  . Nasal Congestion    HPI Brianna Olson is a 62 y.o. female.   Brianna Olson presents with complaints of congestion, cough, fatigue, chest heaviness, ear congestion, which started 10/29. No known fevers. Cough is productive. Occasional headache related to symptoms. No specific shortness of breath . Mother and husband ill. States had a flu shot prior to onset of symptoms. No gi/gu complaints. Hasn't worsened but has not improved. Smokes. Has taken nyquil and advil cold and sinus which does seem to help some. No rash. No sore throat. Without contributing medical history.     ROS per HPI.      Past Medical History:  Diagnosis Date  . Anxiety   . Arthritis     Patient Active Problem List   Diagnosis Date Noted  . Parotid mass     Past Surgical History:  Procedure Laterality Date  . FOOT SURGERY Bilateral     OB History   None      Home Medications    Prior to Admission medications   Medication Sig Start Date End Date Taking? Authorizing Provider  ALPRAZolam Prudy Feeler) 0.25 MG tablet Take 0.25 mg by mouth as needed for anxiety.   Yes [provider]  Apple Cider Vinegar 188 MG CAPS Take by mouth.   Yes [provider]  aspirin 81 MG tablet Take 81 mg by mouth daily.   Yes [provider]  Calcium Carb-Cholecalciferol (CALCIUM + D3 PO) Take 1 tablet by mouth daily.   Yes [provider]  Vitamin D, Cholecalciferol, 1000 UNITS TABS Take 1 tablet by mouth daily.   Yes [provider]  Albuterol Sulfate (PROAIR RESPICLICK) 108 (90 Base) MCG/ACT AEPB Inhale 1-2 puffs into the lungs every 4 (four) hours as needed. 08/25/18   Georgetta Haber, NP  guaiFENesin (MUCINEX) 600 MG 12 hr tablet Take 2 tablets (1,200 mg total) by mouth 2 (two) times daily as needed for up to 5 days. 08/25/18  08/30/18  Georgetta Haber, NP    Family History Family History  Problem Relation Age of Onset  . Diabetes Mother   . Heart disease Mother   . Hypertension Mother   . Heart disease Father   . Hypertension Father   . Cancer Father        lung cancer  . Hypertension Sister   . Cancer Maternal Grandmother        stomach cancer  . Cancer Paternal Grandmother        ovarian cancer  . Cancer Paternal Grandfather        bone cancer  . Breast cancer Neg Hx     Social History Social History   Tobacco Use  . Smoking status: Current Every Day Smoker  . Smokeless tobacco: Never Used  Substance Use Topics  . Alcohol use: Never    Frequency: Never  . Drug use: Never     Allergies   Patient has no known allergies.   Review of Systems Review of Systems   Physical Exam Triage Vital Signs ED Triage Vitals  Enc Vitals Group     BP 08/25/18 0922 (!) 154/75     Pulse Rate 08/25/18 0922 72     Resp 08/25/18 0922 20     Temp 08/25/18 0922 98.2 F (36.8 C)  Temp src --      SpO2 08/25/18 0922 98 %     Weight --      Height --      Head Circumference --      Peak Flow --      Pain Score 08/25/18 0920 0     Pain Loc --      Pain Edu? --      Excl. in GC? --    No data found.  Updated Vital Signs BP (!) 154/75   Pulse 72   Temp 98.2 F (36.8 C)   Resp 20   SpO2 98%    Physical Exam  Constitutional: She is oriented to person, place, and time. She appears well-developed and well-nourished. No distress.  HENT:  Head: Normocephalic and atraumatic.  Right Ear: Tympanic membrane, external ear and ear canal normal.  Left Ear: Tympanic membrane, external ear and ear canal normal.  Nose: Nose normal.  Mouth/Throat: Uvula is midline, oropharynx is clear and moist and mucous membranes are normal. No tonsillar exudate.  Eyes: Pupils are equal, round, and reactive to light. Conjunctivae and EOM are normal.  Cardiovascular: Normal rate, regular rhythm and normal heart  sounds.  Pulmonary/Chest: Effort normal. She has wheezes. She has rhonchi.  Scattered faint wheezes and rhonchi with inspiration; strong course congested cough noted   Neurological: She is alert and oriented to person, place, and time.  Skin: Skin is warm and dry.     UC Treatments / Results  Labs (all labs ordered are listed, but only abnormal results are displayed) Labs Reviewed - No data to display  EKG None  Radiology Dg Chest 2 View  Result Date: 08/25/2018 CLINICAL DATA:  Cough. EXAM: CHEST - 2 VIEW COMPARISON:  None. FINDINGS: The heart size and mediastinal contours are within normal limits. Both lungs are clear. The visualized skeletal structures are unremarkable. IMPRESSION: No active cardiopulmonary disease. Electronically Signed   By: Lupita Raider, M.D.   On: 08/25/2018 10:09    Procedures Procedures (including critical care time)  Medications Ordered in UC Medications  ipratropium-albuterol (DUONEB) 0.5-2.5 (3) MG/3ML nebulizer solution 3 mL (3 mLs Nebulization Given 08/25/18 1013)    Initial Impression / Assessment and Plan / UC Course  I have reviewed the triage vital signs and the nursing notes.  Pertinent labs & imaging results that were available during my care of the patient were reviewed by me and considered in my medical decision making (see chart for details).     Chest xray reassuring. Afebrile. No hypoxia. Lung sounds and patient subjectively endorses improvement s/p neb in clinic today. History and physical consistent with viral illness.  Supportive cares. Inhaler PRN. Return precautions provided. If symptoms worsen or do not improve in the next week to return to be seen or to follow up with PCP.  Patient verbalized understanding and agreeable to plan.   Final Clinical Impressions(s) / UC Diagnoses   Final diagnoses:  Upper respiratory tract infection, unspecified type  Acute bronchitis, unspecified organism     Discharge Instructions       Push fluids to ensure adequate hydration and keep secretions thin.  Inhaler as needed for wheezing, chest tightness, cough.  Mucinex as expectorant or may continue with advil cold and sinus.  If symptoms worsen or do not improve in the next week to return to be seen or to follow up with your PCP.      ED Prescriptions    Medication Sig Dispense  Auth. Provider   Albuterol Sulfate (PROAIR RESPICLICK) 108 (90 Base) MCG/ACT AEPB Inhale 1-2 puffs into the lungs every 4 (four) hours as needed. 1 each Georgetta Haber, NP   guaiFENesin (MUCINEX) 600 MG 12 hr tablet Take 2 tablets (1,200 mg total) by mouth 2 (two) times daily as needed for up to 5 days. 20 tablet Georgetta Haber, NP     Controlled Substance Prescriptions [place] Controlled Substance Registry consulted? Not Applicable   Georgetta Haber, NP 08/25/18 1030

## 2018-08-27 DIAGNOSIS — J410 Simple chronic bronchitis: Secondary | ICD-10-CM | POA: Diagnosis not present

## 2018-08-27 DIAGNOSIS — J320 Chronic maxillary sinusitis: Secondary | ICD-10-CM | POA: Diagnosis not present

## 2018-08-27 DIAGNOSIS — H6521 Chronic serous otitis media, right ear: Secondary | ICD-10-CM | POA: Diagnosis not present

## 2018-08-27 DIAGNOSIS — J322 Chronic ethmoidal sinusitis: Secondary | ICD-10-CM | POA: Diagnosis not present

## 2018-09-15 DIAGNOSIS — J320 Chronic maxillary sinusitis: Secondary | ICD-10-CM | POA: Diagnosis not present

## 2018-09-15 DIAGNOSIS — J040 Acute laryngitis: Secondary | ICD-10-CM | POA: Diagnosis not present

## 2018-09-15 DIAGNOSIS — J322 Chronic ethmoidal sinusitis: Secondary | ICD-10-CM | POA: Diagnosis not present

## 2018-12-05 DIAGNOSIS — H2513 Age-related nuclear cataract, bilateral: Secondary | ICD-10-CM | POA: Diagnosis not present

## 2018-12-05 DIAGNOSIS — H52203 Unspecified astigmatism, bilateral: Secondary | ICD-10-CM | POA: Diagnosis not present

## 2019-07-27 ENCOUNTER — Other Ambulatory Visit: Payer: Self-pay | Admitting: Family Medicine

## 2019-07-27 ENCOUNTER — Other Ambulatory Visit (HOSPITAL_COMMUNITY)
Admission: RE | Admit: 2019-07-27 | Discharge: 2019-07-27 | Disposition: A | Discharge status: Home / Self Care | Payer: BC Managed Care – PPO | Source: Ambulatory Visit | Attending: Family Medicine | Admitting: Family Medicine

## 2019-07-27 DIAGNOSIS — Z124 Encounter for screening for malignant neoplasm of cervix: Secondary | ICD-10-CM | POA: Diagnosis present

## 2019-08-03 LAB — CYTOLOGY - PAP
Adequacy: ABSENT
Diagnosis: NEGATIVE
High risk HPV: NEGATIVE

## 2019-09-21 ENCOUNTER — Ambulatory Visit
Admission: RE | Admit: 2019-09-21 | Discharge: 2019-09-21 | Disposition: A | Discharge status: Home / Self Care | Payer: BC Managed Care – PPO | Source: Ambulatory Visit | Attending: Family Medicine | Admitting: Family Medicine

## 2019-09-21 ENCOUNTER — Other Ambulatory Visit: Payer: Self-pay

## 2019-09-21 ENCOUNTER — Other Ambulatory Visit: Payer: Self-pay | Admitting: Family Medicine

## 2019-09-21 DIAGNOSIS — Z1231 Encounter for screening mammogram for malignant neoplasm of breast: Secondary | ICD-10-CM

## 2020-08-22 ENCOUNTER — Other Ambulatory Visit: Payer: Self-pay | Admitting: Family Medicine

## 2020-08-22 DIAGNOSIS — Z0000 Encounter for general adult medical examination without abnormal findings: Secondary | ICD-10-CM

## 2020-09-28 ENCOUNTER — Other Ambulatory Visit: Payer: Self-pay

## 2020-09-28 ENCOUNTER — Ambulatory Visit
Admission: RE | Admit: 2020-09-28 | Discharge: 2020-09-28 | Disposition: A | Discharge status: Home / Self Care | Payer: BC Managed Care – PPO | Source: Ambulatory Visit | Attending: Family Medicine | Admitting: Family Medicine

## 2020-09-28 DIAGNOSIS — Z0000 Encounter for general adult medical examination without abnormal findings: Secondary | ICD-10-CM

## 2021-04-27 DIAGNOSIS — Z86018 Personal history of other benign neoplasm: Secondary | ICD-10-CM | POA: Diagnosis not present

## 2021-04-27 DIAGNOSIS — D2272 Melanocytic nevi of left lower limb, including hip: Secondary | ICD-10-CM | POA: Diagnosis not present

## 2021-04-27 DIAGNOSIS — L821 Other seborrheic keratosis: Secondary | ICD-10-CM | POA: Diagnosis not present

## 2021-04-27 DIAGNOSIS — L723 Sebaceous cyst: Secondary | ICD-10-CM | POA: Diagnosis not present

## 2021-08-17 DIAGNOSIS — F419 Anxiety disorder, unspecified: Secondary | ICD-10-CM | POA: Diagnosis not present

## 2021-08-17 DIAGNOSIS — E7800 Pure hypercholesterolemia, unspecified: Secondary | ICD-10-CM | POA: Diagnosis not present

## 2021-08-17 DIAGNOSIS — K219 Gastro-esophageal reflux disease without esophagitis: Secondary | ICD-10-CM | POA: Diagnosis not present

## 2021-08-17 DIAGNOSIS — Z0000 Encounter for general adult medical examination without abnormal findings: Secondary | ICD-10-CM | POA: Diagnosis not present

## 2021-08-17 DIAGNOSIS — M25552 Pain in left hip: Secondary | ICD-10-CM | POA: Diagnosis not present

## 2021-08-29 DIAGNOSIS — R059 Cough, unspecified: Secondary | ICD-10-CM | POA: Diagnosis not present

## 2021-08-29 DIAGNOSIS — J209 Acute bronchitis, unspecified: Secondary | ICD-10-CM | POA: Diagnosis not present

## 2021-08-29 DIAGNOSIS — Z6824 Body mass index (BMI) 24.0-24.9, adult: Secondary | ICD-10-CM | POA: Diagnosis not present

## 2021-08-29 DIAGNOSIS — Z20822 Contact with and (suspected) exposure to covid-19: Secondary | ICD-10-CM | POA: Diagnosis not present

## 2021-10-26 ENCOUNTER — Other Ambulatory Visit: Payer: Self-pay | Admitting: Family Medicine

## 2021-10-26 DIAGNOSIS — Z1231 Encounter for screening mammogram for malignant neoplasm of breast: Secondary | ICD-10-CM

## 2021-10-30 ENCOUNTER — Other Ambulatory Visit: Payer: Self-pay

## 2021-10-30 ENCOUNTER — Ambulatory Visit
Admission: RE | Admit: 2021-10-30 | Discharge: 2021-10-30 | Disposition: A | Discharge status: Home / Self Care | Payer: BC Managed Care – PPO | Source: Ambulatory Visit | Attending: Sports Medicine | Admitting: Sports Medicine

## 2021-10-30 ENCOUNTER — Other Ambulatory Visit: Payer: Self-pay | Admitting: Sports Medicine

## 2021-10-30 DIAGNOSIS — M25552 Pain in left hip: Secondary | ICD-10-CM

## 2021-11-13 ENCOUNTER — Ambulatory Visit: Payer: BC Managed Care – PPO

## 2021-11-13 DIAGNOSIS — L309 Dermatitis, unspecified: Secondary | ICD-10-CM | POA: Diagnosis not present

## 2021-11-21 ENCOUNTER — Ambulatory Visit
Admission: RE | Admit: 2021-11-21 | Discharge: 2021-11-21 | Disposition: A | Discharge status: Home / Self Care | Payer: BC Managed Care – PPO | Source: Ambulatory Visit | Attending: Family Medicine | Admitting: Family Medicine

## 2021-11-21 DIAGNOSIS — Z1231 Encounter for screening mammogram for malignant neoplasm of breast: Secondary | ICD-10-CM

## 2022-01-16 DIAGNOSIS — M25552 Pain in left hip: Secondary | ICD-10-CM | POA: Diagnosis not present

## 2022-01-18 DIAGNOSIS — H2513 Age-related nuclear cataract, bilateral: Secondary | ICD-10-CM | POA: Diagnosis not present

## 2022-01-29 DIAGNOSIS — M25552 Pain in left hip: Secondary | ICD-10-CM | POA: Diagnosis not present

## 2022-02-05 DIAGNOSIS — M25552 Pain in left hip: Secondary | ICD-10-CM | POA: Diagnosis not present

## 2022-02-21 DIAGNOSIS — M25552 Pain in left hip: Secondary | ICD-10-CM | POA: Diagnosis not present

## 2022-05-03 DIAGNOSIS — L821 Other seborrheic keratosis: Secondary | ICD-10-CM | POA: Diagnosis not present

## 2022-05-03 DIAGNOSIS — L814 Other melanin hyperpigmentation: Secondary | ICD-10-CM | POA: Diagnosis not present

## 2022-05-03 DIAGNOSIS — D225 Melanocytic nevi of trunk: Secondary | ICD-10-CM | POA: Diagnosis not present

## 2022-05-03 DIAGNOSIS — L578 Other skin changes due to chronic exposure to nonionizing radiation: Secondary | ICD-10-CM | POA: Diagnosis not present

## 2022-09-20 DIAGNOSIS — F17200 Nicotine dependence, unspecified, uncomplicated: Secondary | ICD-10-CM | POA: Diagnosis not present

## 2022-09-20 DIAGNOSIS — E7800 Pure hypercholesterolemia, unspecified: Secondary | ICD-10-CM | POA: Diagnosis not present

## 2022-09-20 DIAGNOSIS — F419 Anxiety disorder, unspecified: Secondary | ICD-10-CM | POA: Diagnosis not present

## 2022-09-20 DIAGNOSIS — M8580 Other specified disorders of bone density and structure, unspecified site: Secondary | ICD-10-CM | POA: Diagnosis not present

## 2022-09-20 DIAGNOSIS — Z0000 Encounter for general adult medical examination without abnormal findings: Secondary | ICD-10-CM | POA: Diagnosis not present

## 2022-11-06 ENCOUNTER — Other Ambulatory Visit: Payer: Self-pay | Admitting: Family Medicine

## 2022-11-06 DIAGNOSIS — D23122 Other benign neoplasm of skin of left lower eyelid, including canthus: Secondary | ICD-10-CM | POA: Diagnosis not present

## 2022-11-06 DIAGNOSIS — Z1231 Encounter for screening mammogram for malignant neoplasm of breast: Secondary | ICD-10-CM

## 2022-11-06 DIAGNOSIS — D23121 Other benign neoplasm of skin of left upper eyelid, including canthus: Secondary | ICD-10-CM | POA: Diagnosis not present

## 2022-11-22 ENCOUNTER — Ambulatory Visit
Admission: RE | Admit: 2022-11-22 | Discharge: 2022-11-22 | Disposition: A | Discharge status: Home / Self Care | Payer: BC Managed Care – PPO | Source: Ambulatory Visit | Attending: Family Medicine | Admitting: Family Medicine

## 2022-11-22 DIAGNOSIS — Z1231 Encounter for screening mammogram for malignant neoplasm of breast: Secondary | ICD-10-CM

## 2022-12-06 DIAGNOSIS — K648 Other hemorrhoids: Secondary | ICD-10-CM | POA: Diagnosis not present

## 2022-12-06 DIAGNOSIS — D122 Benign neoplasm of ascending colon: Secondary | ICD-10-CM | POA: Diagnosis not present

## 2022-12-06 DIAGNOSIS — Z1211 Encounter for screening for malignant neoplasm of colon: Secondary | ICD-10-CM | POA: Diagnosis not present

## 2022-12-06 DIAGNOSIS — D125 Benign neoplasm of sigmoid colon: Secondary | ICD-10-CM | POA: Diagnosis not present

## 2022-12-06 DIAGNOSIS — Z800 Family history of malignant neoplasm of digestive organs: Secondary | ICD-10-CM | POA: Diagnosis not present

## 2022-12-17 DIAGNOSIS — D23122 Other benign neoplasm of skin of left lower eyelid, including canthus: Secondary | ICD-10-CM | POA: Diagnosis not present

## 2022-12-17 DIAGNOSIS — D23121 Other benign neoplasm of skin of left upper eyelid, including canthus: Secondary | ICD-10-CM | POA: Diagnosis not present

## 2022-12-25 DIAGNOSIS — L723 Sebaceous cyst: Secondary | ICD-10-CM | POA: Diagnosis not present

## 2023-03-01 DIAGNOSIS — H2513 Age-related nuclear cataract, bilateral: Secondary | ICD-10-CM | POA: Diagnosis not present

## 2023-05-09 DIAGNOSIS — L578 Other skin changes due to chronic exposure to nonionizing radiation: Secondary | ICD-10-CM | POA: Diagnosis not present

## 2023-05-09 DIAGNOSIS — L814 Other melanin hyperpigmentation: Secondary | ICD-10-CM | POA: Diagnosis not present

## 2023-05-09 DIAGNOSIS — D2272 Melanocytic nevi of left lower limb, including hip: Secondary | ICD-10-CM | POA: Diagnosis not present

## 2023-05-09 DIAGNOSIS — L723 Sebaceous cyst: Secondary | ICD-10-CM | POA: Diagnosis not present

## 2023-05-09 DIAGNOSIS — L821 Other seborrheic keratosis: Secondary | ICD-10-CM | POA: Diagnosis not present

## 2023-05-09 DIAGNOSIS — Z86018 Personal history of other benign neoplasm: Secondary | ICD-10-CM | POA: Diagnosis not present

## 2023-05-09 DIAGNOSIS — L219 Seborrheic dermatitis, unspecified: Secondary | ICD-10-CM | POA: Diagnosis not present

## 2023-05-09 DIAGNOSIS — D225 Melanocytic nevi of trunk: Secondary | ICD-10-CM | POA: Diagnosis not present

## 2023-05-29 DIAGNOSIS — L723 Sebaceous cyst: Secondary | ICD-10-CM | POA: Diagnosis not present

## 2023-10-01 DIAGNOSIS — E7800 Pure hypercholesterolemia, unspecified: Secondary | ICD-10-CM | POA: Diagnosis not present

## 2023-10-01 DIAGNOSIS — Z0000 Encounter for general adult medical examination without abnormal findings: Secondary | ICD-10-CM | POA: Diagnosis not present

## 2023-10-09 DIAGNOSIS — Z124 Encounter for screening for malignant neoplasm of cervix: Secondary | ICD-10-CM | POA: Diagnosis not present

## 2023-10-09 DIAGNOSIS — Z0000 Encounter for general adult medical examination without abnormal findings: Secondary | ICD-10-CM | POA: Diagnosis not present

## 2023-10-09 DIAGNOSIS — F17200 Nicotine dependence, unspecified, uncomplicated: Secondary | ICD-10-CM | POA: Diagnosis not present

## 2023-10-09 DIAGNOSIS — K219 Gastro-esophageal reflux disease without esophagitis: Secondary | ICD-10-CM | POA: Diagnosis not present

## 2023-10-09 DIAGNOSIS — Z1331 Encounter for screening for depression: Secondary | ICD-10-CM | POA: Diagnosis not present

## 2023-10-09 DIAGNOSIS — E7800 Pure hypercholesterolemia, unspecified: Secondary | ICD-10-CM | POA: Diagnosis not present

## 2023-10-09 DIAGNOSIS — M8580 Other specified disorders of bone density and structure, unspecified site: Secondary | ICD-10-CM | POA: Diagnosis not present

## 2023-10-09 DIAGNOSIS — Z23 Encounter for immunization: Secondary | ICD-10-CM | POA: Diagnosis not present

## 2023-10-09 DIAGNOSIS — F419 Anxiety disorder, unspecified: Secondary | ICD-10-CM | POA: Diagnosis not present

## 2023-12-02 ENCOUNTER — Other Ambulatory Visit: Payer: Self-pay | Admitting: Family Medicine

## 2023-12-02 DIAGNOSIS — Z1231 Encounter for screening mammogram for malignant neoplasm of breast: Secondary | ICD-10-CM

## 2023-12-02 DIAGNOSIS — E2839 Other primary ovarian failure: Secondary | ICD-10-CM

## 2023-12-05 ENCOUNTER — Ambulatory Visit
Admission: RE | Admit: 2023-12-05 | Discharge: 2023-12-05 | Disposition: A | Discharge status: Home / Self Care | Payer: PPO | Source: Ambulatory Visit | Attending: Family Medicine | Admitting: Family Medicine

## 2023-12-05 DIAGNOSIS — Z1231 Encounter for screening mammogram for malignant neoplasm of breast: Secondary | ICD-10-CM

## 2024-04-09 DIAGNOSIS — K219 Gastro-esophageal reflux disease without esophagitis: Secondary | ICD-10-CM | POA: Diagnosis not present

## 2024-04-09 DIAGNOSIS — M8589 Other specified disorders of bone density and structure, multiple sites: Secondary | ICD-10-CM | POA: Diagnosis not present

## 2024-04-09 DIAGNOSIS — F419 Anxiety disorder, unspecified: Secondary | ICD-10-CM | POA: Diagnosis not present

## 2024-04-09 DIAGNOSIS — E7800 Pure hypercholesterolemia, unspecified: Secondary | ICD-10-CM | POA: Diagnosis not present

## 2024-04-09 DIAGNOSIS — F17200 Nicotine dependence, unspecified, uncomplicated: Secondary | ICD-10-CM | POA: Diagnosis not present

## 2024-04-09 DIAGNOSIS — R7309 Other abnormal glucose: Secondary | ICD-10-CM | POA: Diagnosis not present

## 2024-04-28 DIAGNOSIS — H2513 Age-related nuclear cataract, bilateral: Secondary | ICD-10-CM | POA: Diagnosis not present

## 2024-05-21 DIAGNOSIS — L814 Other melanin hyperpigmentation: Secondary | ICD-10-CM | POA: Diagnosis not present

## 2024-05-21 DIAGNOSIS — D225 Melanocytic nevi of trunk: Secondary | ICD-10-CM | POA: Diagnosis not present

## 2024-05-21 DIAGNOSIS — L821 Other seborrheic keratosis: Secondary | ICD-10-CM | POA: Diagnosis not present

## 2024-05-21 DIAGNOSIS — Z86018 Personal history of other benign neoplasm: Secondary | ICD-10-CM | POA: Diagnosis not present

## 2024-05-21 DIAGNOSIS — L578 Other skin changes due to chronic exposure to nonionizing radiation: Secondary | ICD-10-CM | POA: Diagnosis not present

## 2024-05-21 DIAGNOSIS — L720 Epidermal cyst: Secondary | ICD-10-CM | POA: Diagnosis not present

## 2024-05-21 DIAGNOSIS — D2272 Melanocytic nevi of left lower limb, including hip: Secondary | ICD-10-CM | POA: Diagnosis not present

## 2024-07-23 ENCOUNTER — Other Ambulatory Visit: Payer: BC Managed Care – PPO

## 2024-07-28 ENCOUNTER — Ambulatory Visit (HOSPITAL_BASED_OUTPATIENT_CLINIC_OR_DEPARTMENT_OTHER)
Admission: RE | Admit: 2024-07-28 | Discharge: 2024-07-28 | Disposition: A | Discharge status: Home / Self Care | Source: Ambulatory Visit | Attending: Family Medicine | Admitting: Family Medicine

## 2024-07-28 DIAGNOSIS — E2839 Other primary ovarian failure: Secondary | ICD-10-CM | POA: Diagnosis not present

## 2024-07-28 DIAGNOSIS — M810 Age-related osteoporosis without current pathological fracture: Secondary | ICD-10-CM | POA: Diagnosis not present

## 2024-07-28 DIAGNOSIS — Z780 Asymptomatic menopausal state: Secondary | ICD-10-CM | POA: Diagnosis not present

## 2024-08-03 DIAGNOSIS — M810 Age-related osteoporosis without current pathological fracture: Secondary | ICD-10-CM | POA: Diagnosis not present
